# Patient Record
Sex: Male | Born: 1995 | Race: White | Hispanic: No | Marital: Single | Smoking: Current every day smoker
Health system: Southern US, Community
[De-identification: ages and names within clinical notes are randomized; demographics above are authoritative.]

## PROBLEM LIST (undated history)

## (undated) DIAGNOSIS — K3184 Gastroparesis: Secondary | ICD-10-CM

---

## 2018-04-09 ENCOUNTER — Emergency Department (HOSPITAL_BASED_OUTPATIENT_CLINIC_OR_DEPARTMENT_OTHER): Payer: BC Managed Care – PPO

## 2018-04-09 ENCOUNTER — Encounter (HOSPITAL_BASED_OUTPATIENT_CLINIC_OR_DEPARTMENT_OTHER): Payer: Self-pay

## 2018-04-09 ENCOUNTER — Other Ambulatory Visit: Payer: Self-pay

## 2018-04-09 ENCOUNTER — Emergency Department (HOSPITAL_BASED_OUTPATIENT_CLINIC_OR_DEPARTMENT_OTHER)
Admission: EM | Admit: 2018-04-09 | Discharge: 2018-04-10 | Disposition: A | Discharge status: Home / Self Care | Payer: BC Managed Care – PPO | Attending: Emergency Medicine | Admitting: Emergency Medicine

## 2018-04-09 DIAGNOSIS — W25XXXA Contact with sharp glass, initial encounter: Secondary | ICD-10-CM | POA: Insufficient documentation

## 2018-04-09 DIAGNOSIS — Y998 Other external cause status: Secondary | ICD-10-CM | POA: Insufficient documentation

## 2018-04-09 DIAGNOSIS — Y92000 Kitchen of unspecified non-institutional (private) residence as  the place of occurrence of the external cause: Secondary | ICD-10-CM | POA: Insufficient documentation

## 2018-04-09 DIAGNOSIS — F17210 Nicotine dependence, cigarettes, uncomplicated: Secondary | ICD-10-CM | POA: Insufficient documentation

## 2018-04-09 DIAGNOSIS — Y9389 Activity, other specified: Secondary | ICD-10-CM | POA: Diagnosis not present

## 2018-04-09 DIAGNOSIS — S61217A Laceration without foreign body of left little finger without damage to nail, initial encounter: Secondary | ICD-10-CM | POA: Diagnosis not present

## 2018-04-09 HISTORY — DX: Gastroparesis: K31.84

## 2018-04-09 MED ORDER — LIDOCAINE HCL 1 % IJ SOLN
INTRAMUSCULAR | Status: AC
  Filled 2018-04-09: qty 20

## 2018-04-09 NOTE — ED Notes (Signed)
Xray states pt refused xray, PA made aware

## 2018-04-09 NOTE — ED Triage Notes (Addendum)
Pt states he cut his left little finger on broken glass 20 min PTA-lac noted lateral side-gauze/kling dsg placed-NAD-steady gait

## 2018-04-09 NOTE — ED Notes (Signed)
Patient transported to X-ray 

## 2018-04-09 NOTE — ED Notes (Signed)
ED Provider at bedside. 

## 2018-04-09 NOTE — ED Provider Notes (Signed)
[facility] EMERGENCY DEPARTMENT Provider Note   CSN: [id number] Arrival date & time: 04/09/18  2043     History   Chief Complaint Chief Complaint  Patient presents with  . Finger Injury    HPI Nicholas Maddox is a 22 y.o. male.  HPI   Patient is a 22 year old male with a history of gastroparesis presenting for laceration to the left fifth digit.  He reports that he was assisting his mother emptying the dishwasher when a glass bowl fell, and he tried to catch it, however it shattered and lacerated his fifth digit.  Patient denies any difficulty with range of motion of his left fifth digit.  He denies any numbness of the finger.  Tetanus shot up-to-date within 3 years.  Past Medical History:  Diagnosis Date  . Gastroparesis     There are no active problems to display for this patient.   History reviewed. No pertinent surgical history.      Home Medications    Prior to Admission medications   Not on File    Family History No family history on file.  Social History Social History   Tobacco Use  . Smoking status: Current Every Day Smoker    Types: Cigarettes  . Smokeless tobacco: Current User  Substance Use Topics  . Alcohol use: Yes    Comment: occ  . Drug use: Never     Allergies   Patient has no known allergies.   Review of Systems Review of Systems  Musculoskeletal: Positive for arthralgias.  Skin: Positive for wound. Negative for color change.  Neurological: Negative for weakness and numbness.     Physical Exam Updated Vital Signs BP 135/87 (BP Location: Left Arm)   Pulse 73   Temp 98.5 F (36.9 C) (Oral)   Resp 18   Ht 6\' 1"  (1.854 m)   Wt 72.6 kg   SpO2 100%   BMI 21.11 kg/m   Physical Exam  Constitutional: He appears well-developed and well-nourished. No distress.  Sitting comfortably in bed.  HENT:  Head: Normocephalic and atraumatic.  Eyes: Conjunctivae are normal. Right eye exhibits no discharge. Left eye exhibits  no discharge.  EOMs normal to gross examination.  Neck: Normal range of motion.  Cardiovascular: Normal rate and regular rhythm.  Intact, 2+ radial pulse.  Pulmonary/Chest:  Normal respiratory effort. Patient converses comfortably. No audible wheeze or stridor.  Abdominal: He exhibits no distension.  Musculoskeletal: Normal range of motion.  Patient exhibits a 1.5 cm laceration on the ulnar side of the left fifth digit.  No tendon involvement.  Patient can perform flexion and extension the finger without difficulty.  No laxity.  Sensation is intact to sharp touch on all sides of the left fifth digit and distally. 2+ radial and ulnar pulse of the LUE.   Neurological: He is alert.  Cranial nerves intact to gross observation. Patient moves extremities without difficulty.  Skin: Skin is warm and dry. He is not diaphoretic.  Psychiatric: He has a normal mood and affect. His behavior is normal. Judgment and thought content normal.  Nursing note and vitals reviewed.    ED Treatments / Results  Labs (all labs ordered are listed, but only abnormal results are displayed) Labs Reviewed - No data to display  EKG None  Radiology No results found.  Procedures .Marland Kitchen.Laceration Repair Date/Time: 04/10/2018 1:19 AM Performed by: Elisha PonderMurray, Drequan Ironside B, PA-C Authorized by: Elisha PonderMurray, Kema Santaella B, PA-C   Consent:    Consent obtained:  Verbal  Consent given by:  Patient   Risks discussed:  Infection and pain Anesthesia (see MAR for exact dosages):    Anesthesia method:  Nerve block   Block location:  Digital Block   Block needle gauge:  25 G   Block anesthetic:  Lidocaine 1% w/o epi   Block injection procedure:  Anatomic landmarks identified, introduced needle, incremental injection, negative aspiration for blood and anatomic landmarks palpated   Block outcome:  Anesthesia achieved Laceration details:    Location:  Finger   Finger location:  L small finger   Length (cm):  1.5 Repair type:    Repair  type:  Simple Pre-procedure details:    Preparation:  Patient was prepped and draped in usual sterile fashion Exploration:    Hemostasis achieved with:  Direct pressure   Wound exploration: wound explored through full range of motion     Wound extent: no tendon damage noted     Contaminated: no   Treatment:    Area cleansed with:  Saline and Betadine   Amount of cleaning:  Extensive   Irrigation solution:  Sterile saline   Irrigation method:  Syringe Skin repair:    Repair method:  Sutures   Suture size:  4-0   Suture material:  Nylon   Suture technique:  Simple interrupted   Number of sutures:  3 Approximation:    Approximation:  Close Post-procedure details:    Dressing:  Non-adherent dressing   Patient tolerance of procedure:  Tolerated well, no immediate complications   (including critical care time)  Medications Ordered in ED Medications  lidocaine (XYLOCAINE) 1 % (with pres) injection (has no administration in time range)     Initial Impression / Assessment and Plan / ED Course  I have reviewed the triage vital signs and the nursing notes.  Pertinent labs & imaging results that were available during my care of the patient were reviewed by me and considered in my medical decision making (see chart for details).     Patient well-appearing and neurovascularly intact in the left upper extremity.  Patient with linear laceration on the ulnar surface of the left fifth digit.  No tendon involvement.  X-ray showing no bony involvement or foreign body.  Primarily closed without difficulty.  Patient tolerated procedure well.  Nonadherent dressing applied.  Patient was instructed on proper wound care and follow-up in 10 days for suture removal.  Prophylactic antibiotics prescribed.  Patient and family are in understanding and agree with the plan of care.  Final Clinical Impressions(s) / ED Diagnoses   Final diagnoses:  Laceration of left little finger without foreign body  without damage to nail, initial encounter    ED Discharge Orders         Ordered    cephALEXin (KEFLEX) 500 MG capsule  4 times daily     04/10/18 0058           Elisha Ponder, PA-C 04/10/18 0121    Sabas Sous, MD 04/10/18 2256

## 2018-04-10 MED ORDER — CEPHALEXIN 250 MG PO CAPS
500.0000 mg | ORAL_CAPSULE | Freq: Once | ORAL | Status: AC
  Administered 2018-04-10: 500 mg via ORAL

## 2018-04-10 MED ORDER — CEPHALEXIN 500 MG PO CAPS: 500.0000 mg | ORAL_CAPSULE | Freq: Four times a day (QID) | ORAL | 0 refills | Status: AC

## 2018-04-10 MED ORDER — CEPHALEXIN 250 MG PO CAPS
ORAL_CAPSULE | ORAL | Status: AC
  Filled 2018-04-10: qty 2

## 2018-04-10 NOTE — Discharge Instructions (Signed)
Please see the information and instructions below regarding your visit.  Your diagnoses today include:  1. Laceration of left little finger without foreign body without damage to nail, initial encounter     Tests performed today include: X-ray of the affected area that did not show any foreign bodies or broken bones Vital signs. See below for your results today.   Medications prescribed:   Take any prescribed medications only as directed.  Ibuprofen alternating with Tylenol for pain.   Please take all of your antibiotics until finished.   You may develop abdominal discomfort or nausea from the antibiotic. If this occurs, you may take it with food. Some patients also get diarrhea with antibiotics. You may help offset this with probiotics which you can buy or get in yogurt. Do not eat or take the probiotics until 2 hours after your antibiotic. Some women develop vaginal yeast infections after antibiotics. If you develop unusual vaginal discharge after being on this medication, please see your primary care provider.   Some people develop allergies to antibiotics. Symptoms of antibiotic allergy can be mild and include a flat rash and itching. They can also be more serious and include:  ?Hives - Hives are raised, red patches of skin that are usually very itchy.  ?Lip or tongue swelling  ?Trouble swallowing or breathing  ?Blistering of the skin or mouth.  If you have any of these serious symptoms, please seek emergency medical care immediately.     Home care instructions:  Follow any educational materials and wound care instructions contained in this packet.   Keep affected area above the level of your heart when possible to minimize swelling. Wash area gently twice a day with warm soapy water. Do not apply alcohol or hydrogen peroxide directly over a wound. Cover the area if it is draining or weeping. Keep the bandage in place for 24 hours and refrain from getting the wound wet for 24  hours. After that, you may get the area wet, but please ensure that you dry it completely afterwards.  Please refrain from soaking sutures for long periods of time, or swimming in chlorinated water   You may apply antibiotic ointment such as Bacitracin or Neosporin.  Follow-up instructions: Suture Removal: Return to the Emergency Department or see your primary care care doctor in 10 days for a recheck of your wound and removal of your sutures or staples.    Return instructions:  Return to the Emergency Department if you have: Fever Worsening pain Worsening swelling of the wound Pus draining from the wound Redness of the skin that moves away from the wound, especially if it streaks away from the affected area  Any other emergent concerns  Your vital signs today were: BP 135/87 (BP Location: Left Arm)    Pulse 73    Temp 98.5 F (36.9 C) (Oral)    Resp 18    Ht 6\' 1"  (1.854 m)    Wt 72.6 kg    SpO2 100%    BMI 21.11 kg/m  If your blood pressure (BP) was elevated on multiple readings during this visit above 130 for the top number or above 80 for the bottom number, please have this repeated by your primary care provider within one month. --------------  Thank you for allowing us to participate in your care today! It was a pleasure taking care of you.

## 2020-04-07 IMAGING — DX DG FINGER LITTLE 2+V*L*
3 series · 3 of 3 positions shown · non-contrast
Comparison: None.

CLINICAL DATA: Laceration

EXAM:
LEFT LITTLE FINGER 2+V

[finger ap]
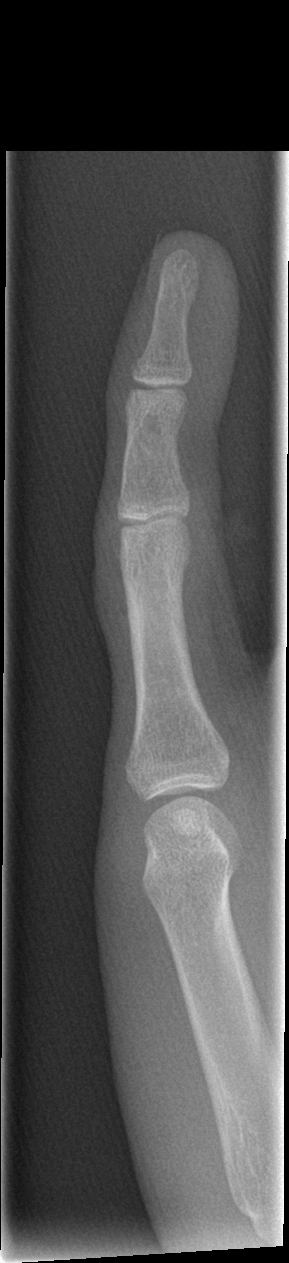

[finger obl]
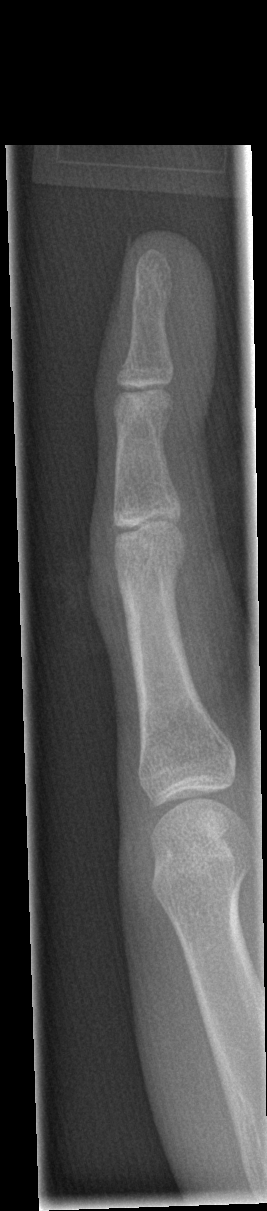

[finger lat]
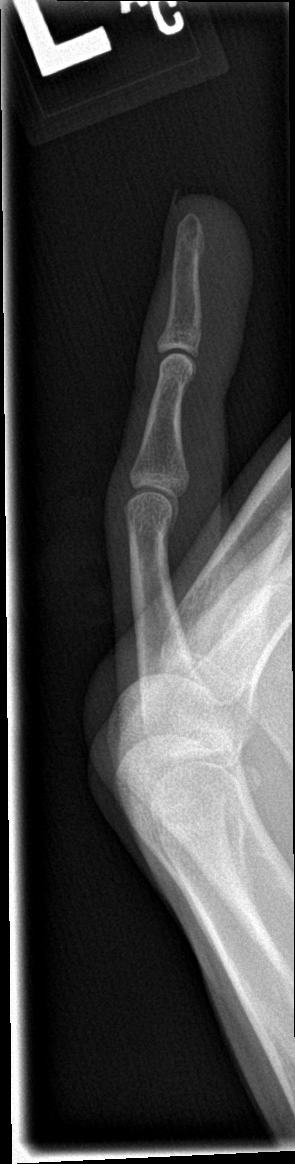

[3 of 3 positions shown; findings below may reference images not displayed]

FINDINGS: No definite acute fracture or malalignment. Lucency on one view at
the base of the distal phalanx is suspected to be secondary to
artifact. No radiopaque foreign body.
IMPRESSION: Negative.
